# Patient Record
Sex: Female | Born: 1935 | Race: White | Hispanic: No
Health system: Southern US, Community
[De-identification: ages and names within clinical notes are randomized; demographics above are authoritative.]

---

## 2010-04-21 ENCOUNTER — Emergency Department (INDEPENDENT_AMBULATORY_CARE_PROVIDER_SITE_OTHER)
Admission: EM | Admit: 2010-04-21 | Discharge: 2010-04-21 | Disposition: A | Discharge status: Other Acute Inpatient Hospital | Payer: Self-pay | Source: Home / Self Care | Admitting: Emergency Medicine

## 2010-04-21 DIAGNOSIS — R5381 Other malaise: Secondary | ICD-10-CM

## 2010-04-21 DIAGNOSIS — G319 Degenerative disease of nervous system, unspecified: Secondary | ICD-10-CM

## 2010-04-21 DIAGNOSIS — R0602 Shortness of breath: Secondary | ICD-10-CM

## 2010-04-21 LAB — COMPREHENSIVE METABOLIC PANEL
AST: 32 U/L (ref 0–37)
Albumin: 4 g/dL (ref 3.5–5.2)
Calcium: 9.4 mg/dL (ref 8.4–10.5)
Creatinine, Ser: 0.9 mg/dL (ref 0.4–1.2)
GFR calc Af Amer: >60 mL/min (ref >60)

## 2010-04-21 LAB — URINALYSIS, ROUTINE W REFLEX MICROSCOPIC
Hgb urine dipstick: NEGATIVE
Specific Gravity, Urine: 1.011 (ref 1.005–1.030)
Urine Glucose, Fasting: NEGATIVE mg/dL
pH: 6 (ref 5.0–8.0)

## 2010-04-21 LAB — CBC
HCT: 40.2 % (ref 36.0–46.0)
Hemoglobin: 14.1 g/dL (ref 12.0–15.0)
MCHC: 35.1 g/dL (ref 30.0–36.0)
MCV: 82.4 fL (ref 78.0–100.0)

## 2010-04-21 LAB — URINE MICROSCOPIC-ADD ON

## 2010-04-21 LAB — POCT CARDIAC MARKERS
CKMB, poc: <1 ng/mL — ABNORMAL LOW (ref 1.0–8.0)
Myoglobin, poc: 41.7 ng/mL (ref 12–200)

## 2010-04-21 LAB — DIFFERENTIAL
Basophils Absolute: 0 10*3/uL (ref 0.0–0.1)
Lymphocytes Relative: 23 % (ref 12–46)
Lymphs Abs: 1.4 10*3/uL (ref 0.7–4.0)
Monocytes Absolute: 0.6 10*3/uL (ref 0.1–1.0)
Neutro Abs: 3.9 10*3/uL (ref 1.7–7.7)

## 2010-04-21 LAB — TSH: TSH: 2.262 u[IU]/mL (ref 0.350–4.500)

## 2010-04-21 LAB — POCT B-TYPE NATRIURETIC PEPTIDE (BNP): B Natriuretic Peptide, POC: 37 pg/mL (ref 0–100)

## 2011-05-25 DEATH — deceased

## 2012-07-10 IMAGING — CR DG CHEST 2V
1 series · 1 of 1 positions shown · non-contrast
Comparison: None.

CLINICAL DATA: Body aches and weakness.  Shortness of breath.

CHEST - 2 VIEW

[view not recorded]
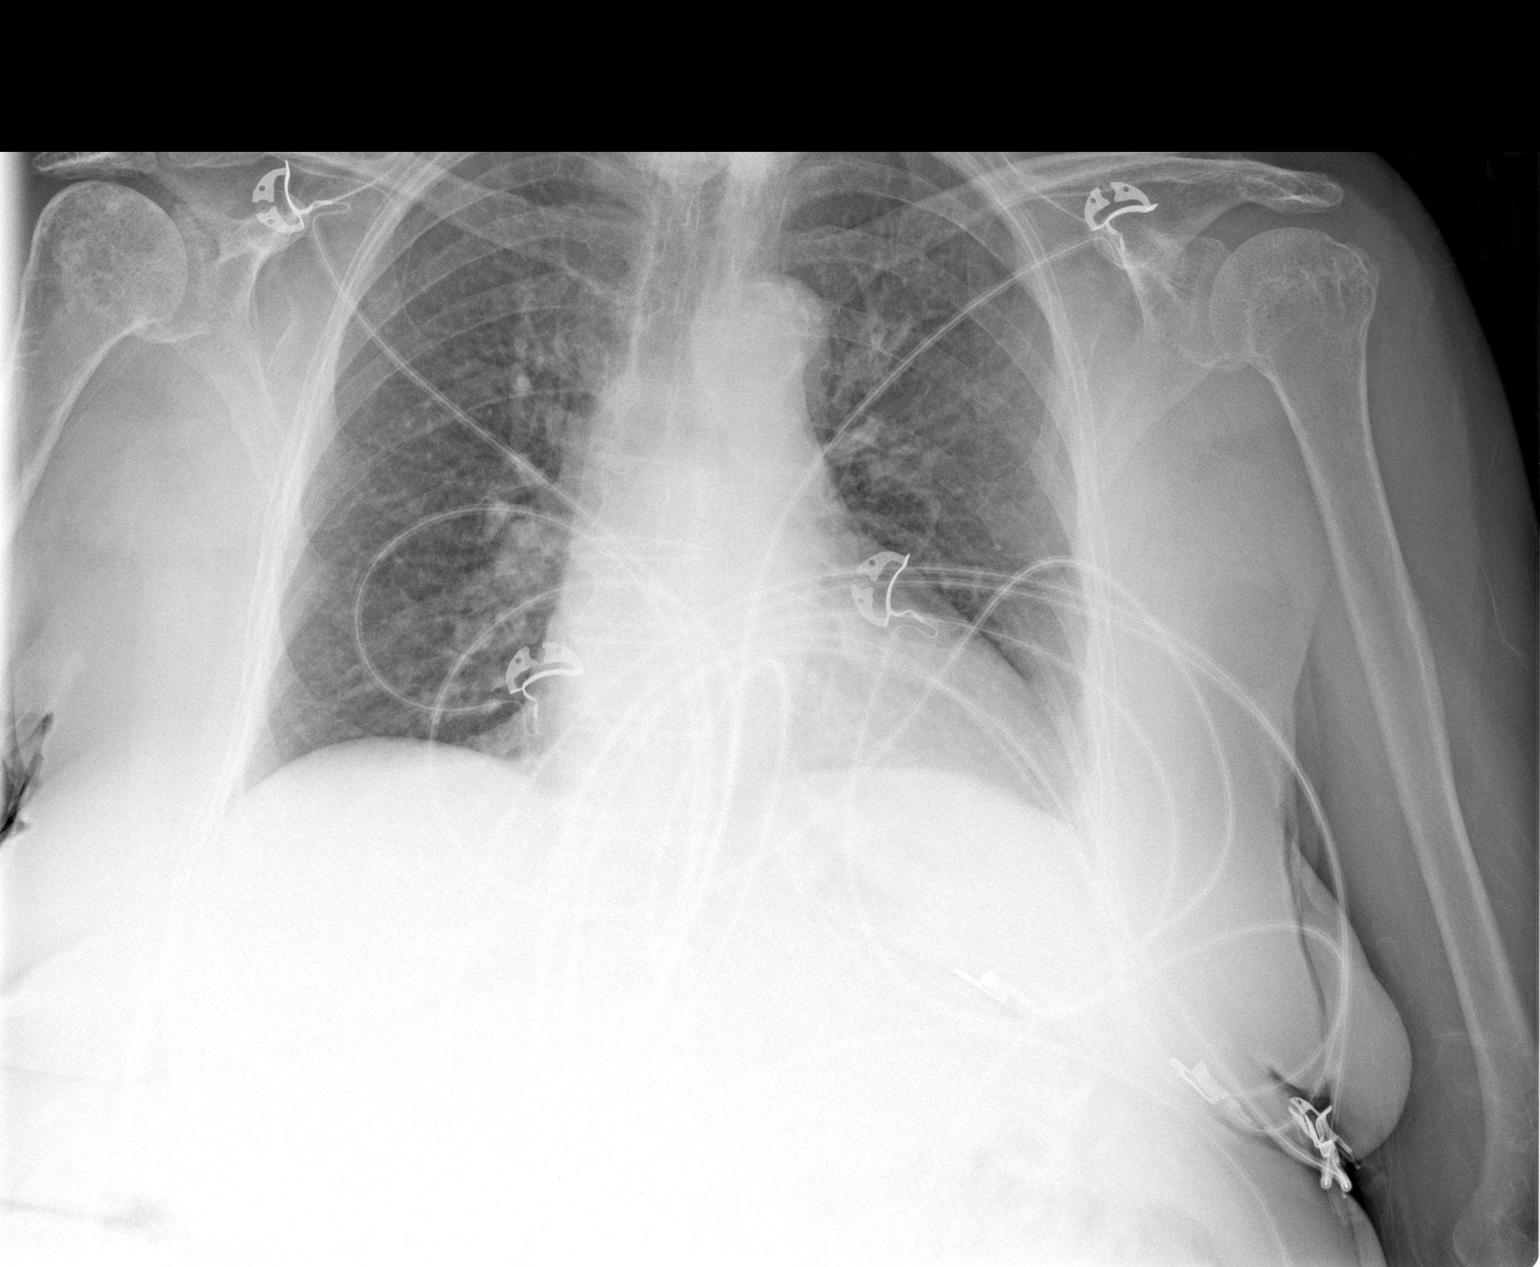

[1 of 1 positions shown; findings below may reference images not displayed]

FINDINGS: Lungs are clear.  Heart size is normal.  No effusion or
pneumothorax.
IMPRESSION: No acute disease.

## 2012-07-10 IMAGING — CT CT HEAD W/O CM
1 series · 16 of 30 positions shown, 20 images · non-contrast
Comparison: None.

CLINICAL DATA: Generalized weakness and body aches, poor appetite

CT HEAD WITHOUT CONTRAST
TECHNIQUE: Contiguous axial images were obtained from the base of
the skull through the vertex without contrast.

[Series 2: head 4.8 h37s · axial · 0.45mm/px · z∈[-142,-6]mm · 16 of 32 slices shown, 20 images]
[im 2/32  brain]
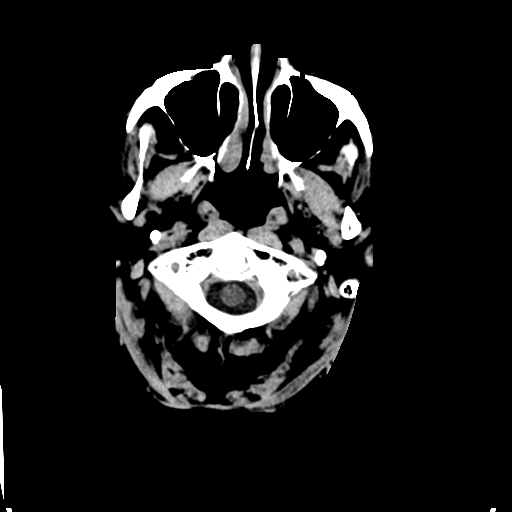
[im 2/32  bone]
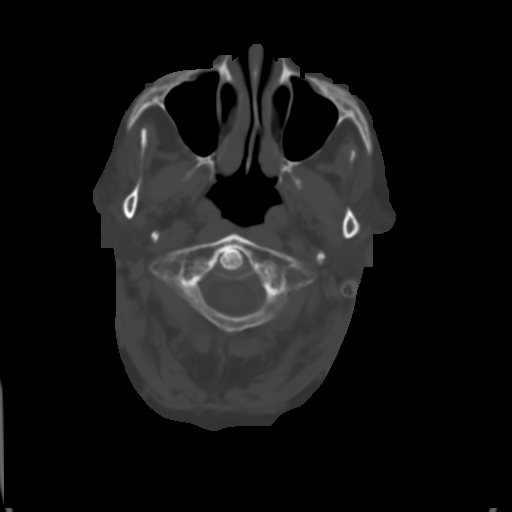
[im 4/32  brain]
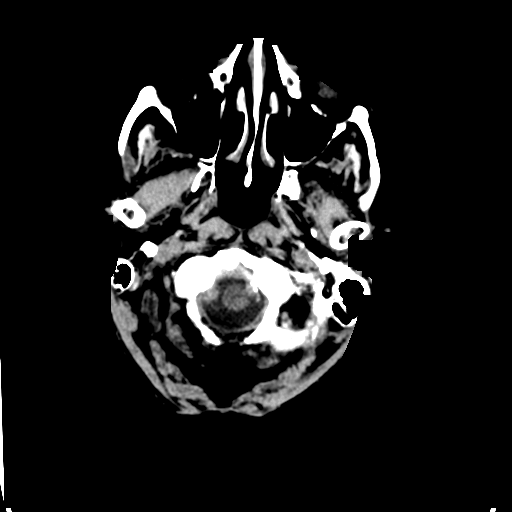
[im 6/32  brain]
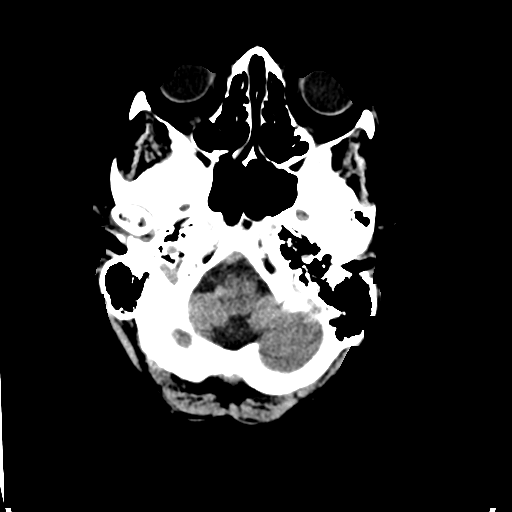
[im 8/32  brain]
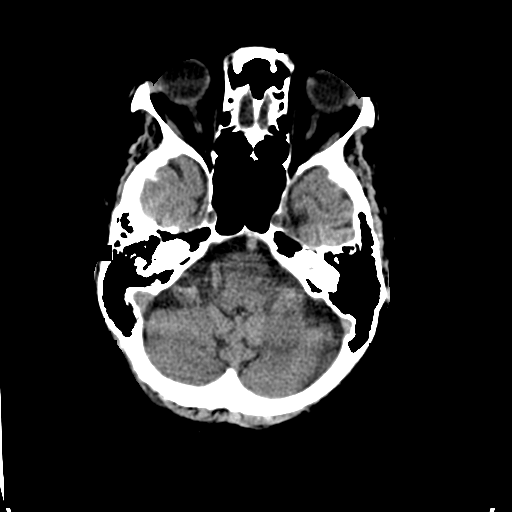
[im 9/32  brain]
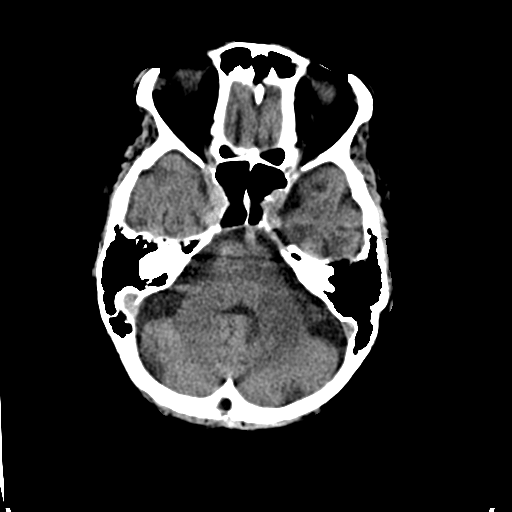
[im 9/32  bone]
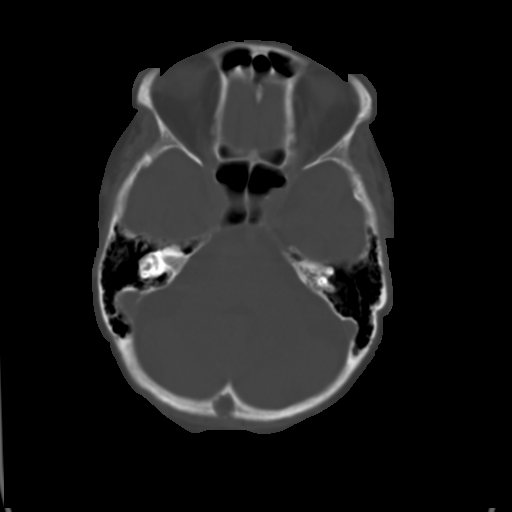
[im 11/32  brain]
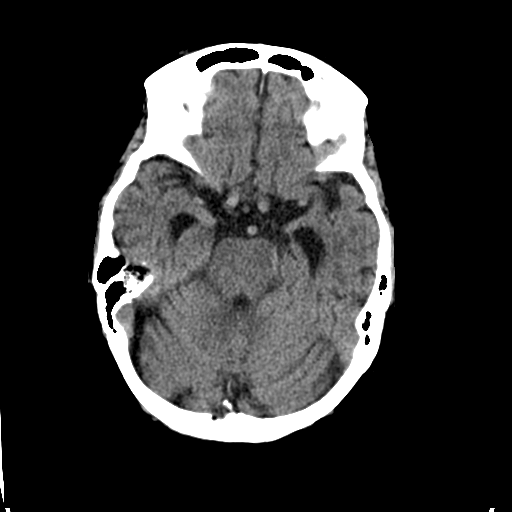
[im 13/32  brain]
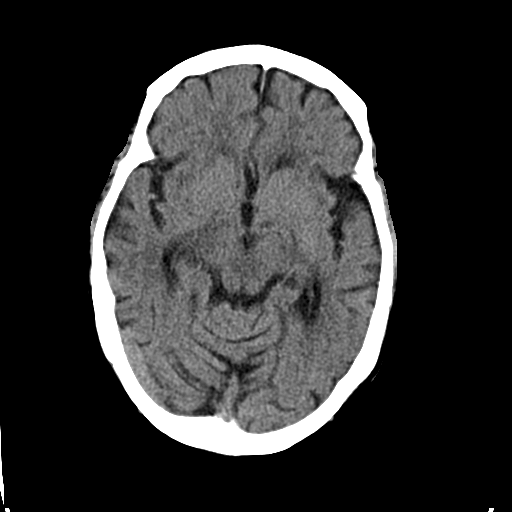
[im 15/32  brain]
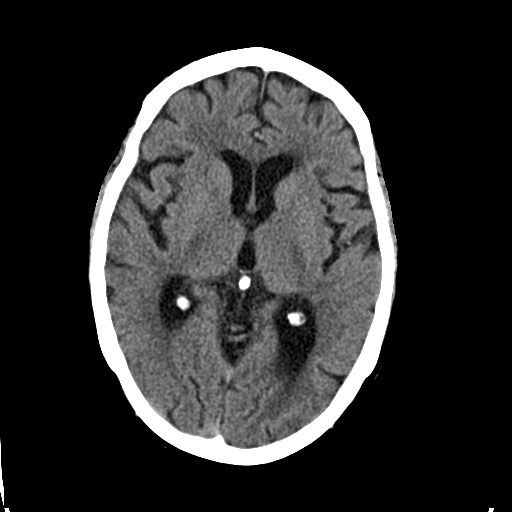
[im 17/32  brain]
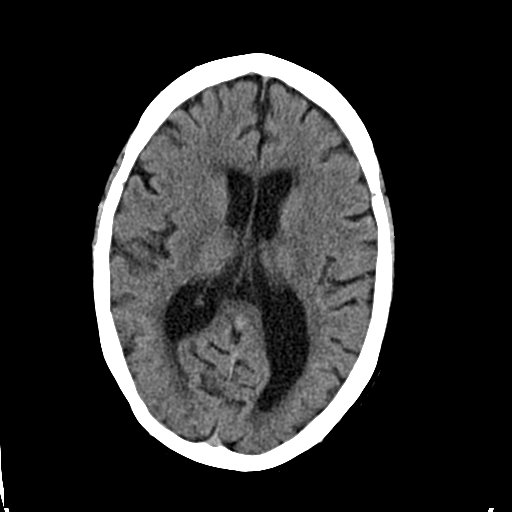
[im 17/32  bone]
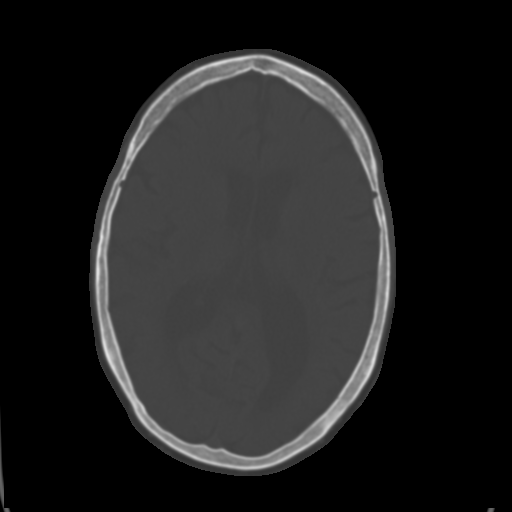
[im 19/32  brain]
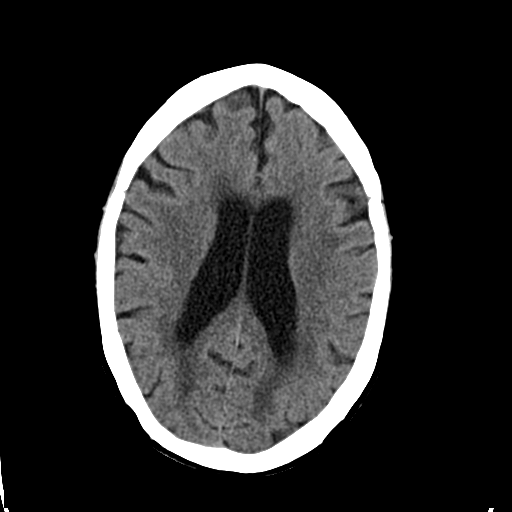
[im 21/32  brain]
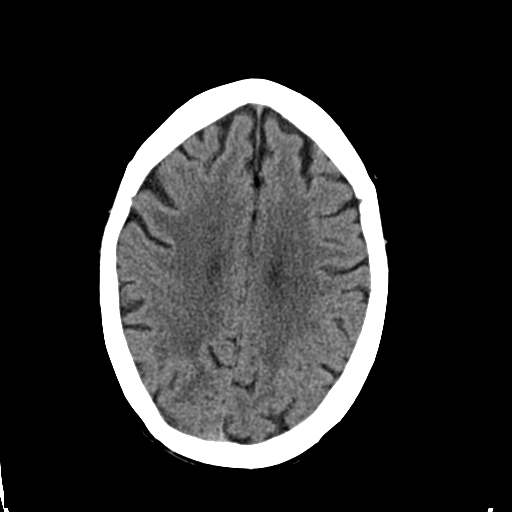
[im 23/32  brain]
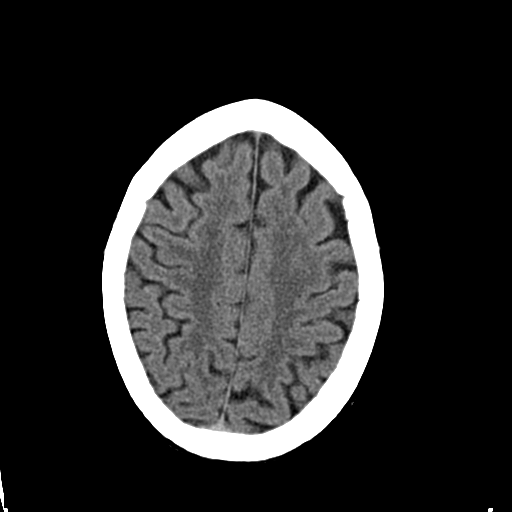
[im 24/32  brain]
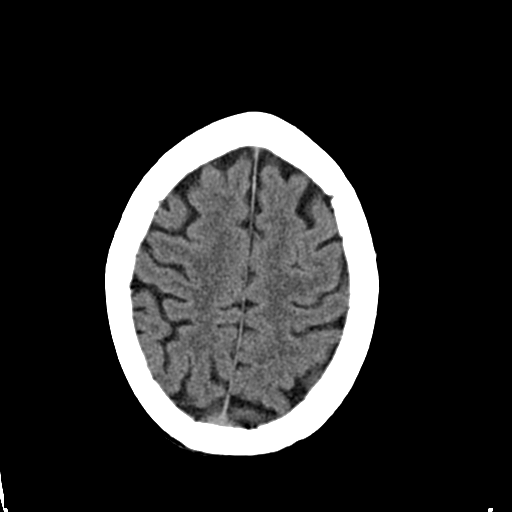
[im 24/32  bone]
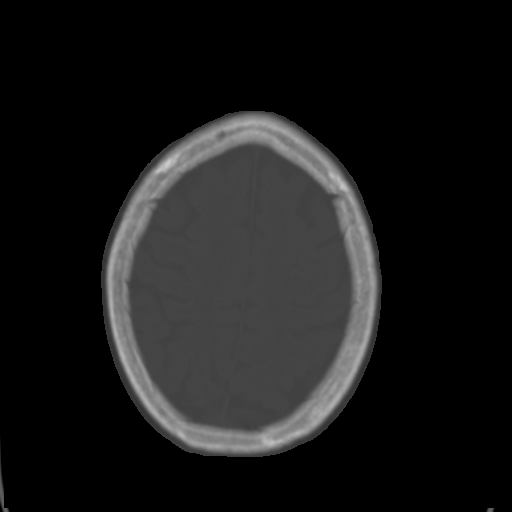
[im 26/32  brain]
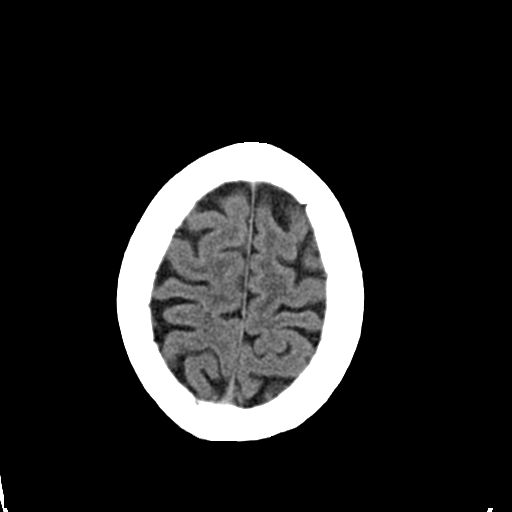
[im 28/32  brain]
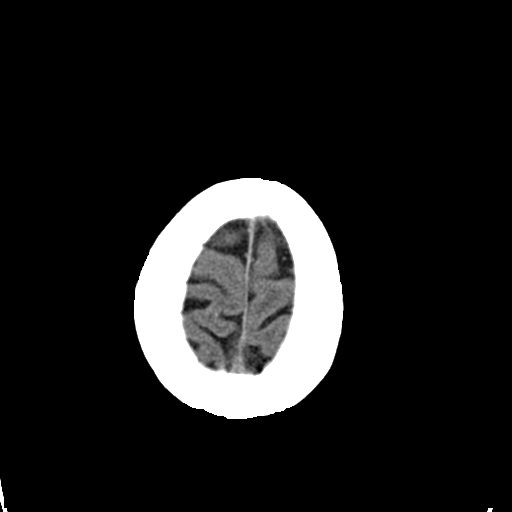
[im 30/32  brain]
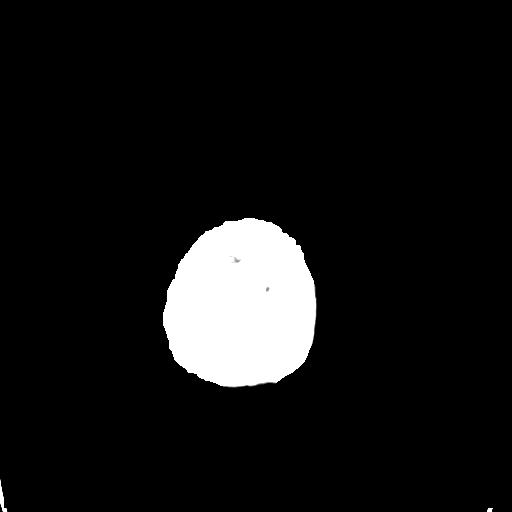

[16 of 30 positions shown; findings below may reference images not displayed]

FINDINGS: The ventricular system is prominent, as are the cortical
sulci, indicative of diffuse atrophy.  The septum is midline in
position.  Moderate small vessel ischemic change is noted
throughout the periventricular white matter.  No hemorrhage, mass
lesion, or acute infarction is seen.  On bone window images, no
acute calvarial abnormality is noted.  The paranasal sinuses are
well pneumatized.
IMPRESSION: Atrophy and moderate small vessel ischemic change.  No acute
intracranial abnormality.

## 2014-12-28 ENCOUNTER — Telehealth: Payer: Self-pay | Admitting: Internal Medicine

## 2014-12-28 NOTE — Telephone Encounter (Signed)
Error

## 2015-01-10 ENCOUNTER — Inpatient Hospital Stay: Payer: Self-pay | Admitting: Internal Medicine

## 2015-01-10 ENCOUNTER — Other Ambulatory Visit: Payer: Self-pay
